# Patient Record
Sex: Female | Born: 1980 | Race: White | Hispanic: No | Marital: Single | Smoking: Never smoker
Health system: Southern US, Community
[De-identification: ages and names within clinical notes are randomized; demographics above are authoritative.]

## PROBLEM LIST (undated history)

## (undated) DIAGNOSIS — Z9889 Other specified postprocedural states: Secondary | ICD-10-CM

## (undated) DIAGNOSIS — Z98891 History of uterine scar from previous surgery: Secondary | ICD-10-CM

## (undated) HISTORY — DX: History of uterine scar from previous surgery: Z98.891

## (undated) HISTORY — DX: Other specified postprocedural states: Z98.890

## (undated) HISTORY — PX: TYMPANOSTOMY TUBE PLACEMENT: SHX32

---

## 2013-04-23 DIAGNOSIS — Z9889 Other specified postprocedural states: Secondary | ICD-10-CM

## 2013-04-23 HISTORY — DX: Other specified postprocedural states: Z98.890

## 2013-04-23 HISTORY — PX: LEEP: SHX91

## 2014-12-27 ENCOUNTER — Ambulatory Visit (INDEPENDENT_AMBULATORY_CARE_PROVIDER_SITE_OTHER): Payer: Self-pay | Admitting: Pulmonary Disease

## 2014-12-27 ENCOUNTER — Telehealth: Payer: Self-pay | Admitting: Pulmonary Disease

## 2014-12-27 ENCOUNTER — Encounter (HOSPITAL_BASED_OUTPATIENT_CLINIC_OR_DEPARTMENT_OTHER): Payer: Self-pay | Admitting: *Deleted

## 2014-12-27 ENCOUNTER — Encounter: Payer: Self-pay | Admitting: Pulmonary Disease

## 2014-12-27 ENCOUNTER — Emergency Department (HOSPITAL_BASED_OUTPATIENT_CLINIC_OR_DEPARTMENT_OTHER)
Admission: EM | Admit: 2014-12-27 | Discharge: 2014-12-27 | Disposition: A | Discharge status: Home / Self Care | Payer: Self-pay | Attending: Emergency Medicine | Admitting: Emergency Medicine

## 2014-12-27 ENCOUNTER — Other Ambulatory Visit (INDEPENDENT_AMBULATORY_CARE_PROVIDER_SITE_OTHER): Payer: Self-pay

## 2014-12-27 ENCOUNTER — Emergency Department (HOSPITAL_BASED_OUTPATIENT_CLINIC_OR_DEPARTMENT_OTHER): Payer: Self-pay

## 2014-12-27 VITALS — BP 108/66 | HR 74 | Ht 62.0 in | Wt 131.2 lb

## 2014-12-27 DIAGNOSIS — R0602 Shortness of breath: Secondary | ICD-10-CM

## 2014-12-27 DIAGNOSIS — R0789 Other chest pain: Secondary | ICD-10-CM | POA: Insufficient documentation

## 2014-12-27 LAB — CBC WITH DIFFERENTIAL/PLATELET
BASOS ABS: 0 10*3/uL (ref 0.0–0.1)
Basophils Relative: 0.6 % (ref 0.0–3.0)
EOS ABS: 0.1 10*3/uL (ref 0.0–0.7)
Eosinophils Relative: 0.9 % (ref 0.0–5.0)
HCT: 42.6 % (ref 36.0–46.0)
Hemoglobin: 14.3 g/dL (ref 12.0–15.0)
LYMPHS ABS: 2.3 10*3/uL (ref 0.7–4.0)
Lymphocytes Relative: 37.5 % (ref 12.0–46.0)
MCHC: 33.5 g/dL (ref 30.0–36.0)
MCV: 85.9 fl (ref 78.0–100.0)
MONO ABS: 0.3 10*3/uL (ref 0.1–1.0)
Monocytes Relative: 4.9 % (ref 3.0–12.0)
NEUTROS PCT: 56.1 % (ref 43.0–77.0)
Neutro Abs: 3.5 10*3/uL (ref 1.4–7.7)
Platelets: 249 10*3/uL (ref 150.0–400.0)
RBC: 4.96 Mil/uL (ref 3.87–5.11)
RDW: 13.1 % (ref 11.5–15.5)
WBC: 6.3 10*3/uL (ref 4.0–10.5)

## 2014-12-27 LAB — TROPONIN I

## 2014-12-27 LAB — D-DIMER, QUANTITATIVE (NOT AT ARMC)

## 2014-12-27 MED ORDER — ALBUTEROL SULFATE HFA 108 (90 BASE) MCG/ACT IN AERS: 2.0000 | INHALATION_SPRAY | Freq: Four times a day (QID) | RESPIRATORY_TRACT | Status: DC | PRN

## 2014-12-27 NOTE — Telephone Encounter (Signed)
LMTCB

## 2014-12-27 NOTE — ED Notes (Signed)
ekg in progress

## 2014-12-27 NOTE — ED Notes (Signed)
Patient transported to X-ray 
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
## 2014-12-27 NOTE — Patient Instructions (Signed)
We'll order some lung function tests. Start you on albuterol inhaler when necessary. Check some blood tests. Return to clinic in 2 months.

## 2014-12-27 NOTE — ED Notes (Signed)
EKG performed and pt placed on cardiac monitoring
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
## 2014-12-27 NOTE — Discharge Instructions (Signed)
Nonspecific Chest Pain  Take Tylenol or Advil for pain as needed. Call the [facility] or any of the numbers on the resource guide to get a primary care physician and to arrange to be seen if not feeling better within one or 2 weeks. Chest pain can be caused by many different conditions. There is always a chance that your pain could be related to something serious, such as a heart attack or a blood clot in your lungs. Chest pain can also be caused by conditions that are not life-threatening. If you have chest pain, it is very important to follow up with your health care provider. CAUSES  Chest pain can be caused by:  Heartburn.  Pneumonia or bronchitis.  Anxiety or stress.  Inflammation around your heart (pericarditis) or lung (pleuritis or pleurisy).  A blood clot in your lung.  A collapsed lung (pneumothorax). It can develop suddenly on its own (spontaneous pneumothorax) or from trauma to the chest.  Shingles infection (varicella-zoster virus).  Heart attack.  Damage to the bones, muscles, and cartilage that make up your chest wall. This can include:  Bruised bones due to injury.  Strained muscles or cartilage due to frequent or repeated coughing or overwork.  Fracture to one or more ribs.  Sore cartilage due to inflammation (costochondritis). RISK FACTORS  Risk factors for chest pain may include:  Activities that increase your risk for trauma or injury to your chest.  Respiratory infections or conditions that cause frequent coughing.  Medical conditions or overeating that can cause heartburn.  Heart disease or family history of heart disease.  Conditions or health behaviors that increase your risk of developing a blood clot.  Having had chicken pox (varicella zoster). SIGNS AND SYMPTOMS Chest pain can feel like:  Burning or tingling on the surface of your chest or deep in your chest.  Crushing, pressure, aching, or squeezing pain.  Dull  or sharp pain that is worse when you move, cough, or take a deep breath.  Pain that is also felt in your back, neck, shoulder, or arm, or pain that spreads to any of these areas. Your chest pain may come and go, or it may stay constant. DIAGNOSIS Lab tests or other studies may be needed to find the cause of your pain. Your health care provider may have you take a test called an ambulatory ECG (electrocardiogram). An ECG records your heartbeat patterns at the time the test is performed. You may also have other tests, such as:  Transthoracic echocardiogram (TTE). During echocardiography, sound waves are used to create a picture of all of the heart structures and to look at how blood flows through your heart.  Transesophageal echocardiogram (TEE).This is a more advanced imaging test that obtains images from inside your body. It allows your health care provider to see your heart in finer detail.  Cardiac monitoring. This allows your health care provider to monitor your heart rate and rhythm in real time.  Holter monitor. This is a portable device that records your heartbeat and can help to diagnose abnormal heartbeats. It allows your health care provider to track your heart activity for several days, if needed.  Stress tests. These can be done through exercise or by taking medicine that makes your heart beat more quickly.  Blood tests.  Imaging tests. TREATMENT  Your treatment depends on what is causing your chest pain. Treatment may include:  Medicines. These may include:  Acid blockers for heartburn.  Anti-inflammatory medicine.  Pain medicine for inflammatory conditions.  Antibiotic medicine, if an infection is present.  Medicines to dissolve blood clots.  Medicines to treat coronary artery disease.  Supportive care for conditions that do not require medicines. This may include:  Resting.  Applying heat or cold packs to injured areas.  Limiting activities until pain  decreases. HOME CARE INSTRUCTIONS  If you were prescribed an antibiotic medicine, finish it all even if you start to feel better.  Avoid any activities that bring on chest pain.  Do not use any tobacco products, including cigarettes, chewing tobacco, or electronic cigarettes. If you need help quitting, ask your health care provider.  Do not drink alcohol.  Take medicines only as directed by your health care provider.  Keep all follow-up visits as directed by your health care provider. This is important. This includes any further testing if your chest pain does not go away.  If heartburn is the cause for your chest pain, you may be told to keep your head raised (elevated) while sleeping. This reduces the chance that acid will go from your stomach into your esophagus.  Make lifestyle changes as directed by your health care provider. These may include:  Getting regular exercise. Ask your health care provider to suggest some activities that are safe for you.  Eating a heart-healthy diet. A registered dietitian can help you to learn healthy eating options.  Maintaining a healthy weight.  Managing diabetes, if necessary.  Reducing stress. SEEK MEDICAL CARE IF:  Your chest pain does not go away after treatment.  You have a rash with blisters on your chest.  You have a fever. SEEK IMMEDIATE MEDICAL CARE IF:   Your chest pain is worse.  You have an increasing cough, or you cough up blood.  You have severe abdominal pain.  You have severe weakness.  You faint.  You have chills.  You have sudden, unexplained chest discomfort.  You have sudden, unexplained discomfort in your arms, back, neck, or jaw.  You have shortness of breath at any time.  You suddenly start to sweat, or your skin gets clammy.  You feel nauseous or you vomit.  You suddenly feel light-headed or dizzy.  Your heart begins to beat quickly, or it feels like it is skipping beats. These symptoms may  represent a serious problem that is an emergency. Do not wait to see if the symptoms will go away. Get medical help right away. Call your local emergency services (911 in the U.S.). Do not drive yourself to the hospital.   This information is not intended to replace advice given to you by your health care provider. Make sure you discuss any questions you have with your health care provider.   Document Released: 11/19/2004 Document Revised: 03/02/2014 Document Reviewed: 09/15/2013 Elsevier Interactive Patient Education 2016 [employer].  Emergency Department Resource Guide 1) Find a Doctor and Pay Out of Pocket Although you won't have to find out who is covered by your insurance plan, it is a good idea to ask around and get recommendations. You will then need to call the office and see if the doctor you have chosen will accept you as a new patient and what types of options they offer for patients who are self-pay. Some doctors offer discounts or will set up payment plans for their patients who do not have insurance, but you will need to ask so you aren't surprised when you get to your appointment.  2) Contact Your Local Health Department Not  all health departments have doctors that can see patients for sick visits, but many do, so it is worth a call to see if yours does. If you don't know where your local health department is, you can check in your phone book. The CDC also has a tool to help you locate your state's health department, and many state websites also have listings of all of their local health departments.  3) Find a Walk-in Clinic If your illness is not likely to be very severe or complicated, you may want to try a walk in clinic. These are popping up all over the country in pharmacies, drugstores, and shopping centers. They're usually staffed by nurse practitioners or physician assistants that have been trained to treat common illnesses and complaints. They're usually fairly quick and  inexpensive. However, if you have serious medical issues or chronic medical problems, these are probably not your best option.  No Primary Care Doctor: - Call Health Connect at  [phone] - they can help you locate a primary care doctor that  accepts your insurance, provides certain services, etc. - Physician Referral Service- [phone]  Chronic Pain Problems: Organization         Address  Phone   Notes  [facility]  [phone] Patients need to be referred by their primary care doctor.   Medication Assistance: Organization         Address  Phone   Notes  [facility] [address] [place], [place] [address] [phone] --Must be a resident of [facility] [place] -- Must have NO insurance coverage whatsoever (no Medicaid/ Medicare, etc.) -- The pt. MUST have a primary care doctor that directs their care regularly and follows them in the community   MedAssist  [phone]   [employer]  [phone]) [phone]    Agencies that provide inexpensive medical care: Organization         Address  Phone   Notes  [facility]  [phone]   [facility]    [phone]   [facility] [address] [place], [place] [address] [phone]   [facility] [address], [place] [phone]   Planned Parenthood    [phone]   [facility]    [phone]) [phone]   [facility]  [address], [place] Phone:  [phone], Fax:  [phone] Hours of Operation:  9 am - 6 pm, M-F.  Also accepts Medicaid/Medicare and self-pay.  [facility]  [address], [place] Phone: [phone]) [phone], Fax: [phone]. Hours of Operation:  8:30 am - 5:30 pm, M-F.  Also accepts Medicaid and self-pay.  [facility] [place] [address], [place] Phone: [phone]) [phone]   [facility] [address], [place], [place] [phone], Ext. [phone] Mondays & Thursdays: 7-9 AM.  First 15 patients are seen on a first come, first serve basis.    Medicaid-accepting [facility] Providers:  Organization         Address  Phone   Notes  [facility] [address], [place] [phone] Also accepts self-pay patients.  [facility] [address], [place]  [phone]   [facility] [address], [place] [phone]) [phone]   [facility] [address] [place] [phone]   Renaye Rakers [address], [place]   [phone] Only accepts [place] Access [place] patients after they have their name applied to their card.   Self-Pay (no insurance) in [facility]:  Organization         Address  Phone   Notes  Sickle Cell Patients, [facility] [address] [place], [place] [phone]   [facility] [address] [place], [place] [phone]   [facility]  [address], [place] [phone]   [facility]/Dr. Osei-Bonsu  [address], [place] or [address], [place] [phone] Phone number for both [place] and [place] locations is the same.  [facility]gent Medical and [facility] [address], [place] [phone]   [facility] [address] [place] or [address] [phone]   [facility] [address], [place] [phone], phone; [phone], fax Sees patients 1st and 3rd Saturday of every month.  Must not qualify for public or private insurance (i.e. Medicaid, Medicare, [place] Health Choice, Veterans' Benefits)  Household income should be no more than 200% of the poverty level The clinic cannot treat you if you are pregnant or  think you are pregnant  Sexually transmitted diseases are not treated at the clinic.    Dental Care: Organization         Address  Phone  Notes  [facility] [address] [place], [place] [phone] Accepts children up to age 27 who are enrolled in [place] or [place] Health Choice; pregnant women with a Medicaid card; and children who have applied for Medicaid or [place] Health Choice, but were declined, whose parents can pay a reduced fee at time of service.  [facility] of [facility]  [address], [place] [phone] Accepts children up to age 53 who are enrolled in [place] or [place] Health Choice; pregnant women with a Medicaid card; and children who have applied for Medicaid or [place] Health Choice, but were declined, whose parents can pay a reduced fee at time of service.  [facility]  [address] [place], [place] [phone] Patients are seen by appointment only. Walk-ins are not accepted. [facility] will see patients 61 years of age and older. Monday - Tuesday (8am-5pm) Most Wednesdays (8:30-5pm) $30 per visit, cash only  [facility]  [address], [facility] [phone] Patients are seen by appointment only. Walk-ins are not accepted. [facility] will see patients 50 years of age and older. One Wednesday Evening (Monthly: Volunteer Based).  $30 per visit, cash only  [employer]  [phone] for adults; Children under age 38, call Graduate Pediatric Dentistry at [phone]. Children aged 106-14, please call [phone] to request a pediatric application.  Dental services are provided in all areas of dental care including fillings, crowns and bridges, complete and partial dentures, implants, gum treatment, root canals, and extractions. Preventive care is also provided. Treatment is provided to both adults  and children. Patients are selected via a lottery and there is often a waiting list.   [facility] [address] [place]  [phone] [email or web address]  [facility] [address] [place], [place] [phone], Ext. [phone] Second and Fourth Thursday of each month, opens at 6:30 AM; Clinic ends at 9 AM.  Patients are seen on a first-come first-served basis, and a limited number are seen during each clinic.   [facility]  [address] Ether Griffins [place], [place] [phone]   Eligibility Requirements You must have lived in [place], [place], or [place] counties for at least the last three months.   You cannot be eligible for state or federal sponsored [place], including [employer], [place], or [employer].   You generally cannot be eligible for healthcare insurance through your employer.    How to apply: Eligibility screenings are held every Tuesday and Wednesday afternoon from 1:00 pm until 4:00 pm. You do not need an appointment for the interview!  [facility] [address] [place], [place] [phone]   [facility]  [phone]   [facility] Health Department  [phone]   [facility]  [phone]    Behavioral Health Resources in the Community: Intensive Outpatient Programs Organization         Address  Phone  Notes  [facility] [address] [place] [address] [place], [place] [phone]   [facility] Outpatient [address] [place], [place] [phone]   [facility] [address] [place], [place]  [phone]   [facility] [address],  [place], [place] [phone] or [phone]   Substance Abuse Resources Organization         Address  Phone  Notes  Alcohol [facility] Drug Services  [phone]   [facility]  [phone]   [facility] [place]  [phone]     Floydene Flock  [phone]   Residential & Outpatient Substance Abuse Program  [phone]   Psychological Services Organization         Address  Phone  Notes  [facility] Health  [phone]   [facility]  [phone]   [facility] [address], [place] [phone] or [phone]    Mobile Crisis Teams Organization         Address  Phone  Notes  [facility], Mobile Crisis Care Unit  [phone]   [facility]  [address] [place], [place] [phone]   Doristine Locks [address] [place] [phone]    Self-Help/Support Groups Organization         Address  Phone             Notes  [facility] [place] - variety of support groups  336- I7437963 Call for more information  Narcotics Anonymous (NA), [facility] [address], [employer] [place]  2 meetings at this location   Statistician         Address  Phone  Notes  [facility] [address],    [place]  [phone]   [facility]  [address], [place] [address], [place], [place] [phone]   [facility] [address] [place], [place] [phone] Admissions: 8am-3pm M-F  [facility] [address],    [place], [place] [phone]   [facility] [address] Starling Manns [place], [place] [phone]   [facility] [address]  [place], [place] [phone]   [facility] - Intensive Outpatient [phone] [address], [place], [place] [phone]   [facility] ([facility]iction [facility] [address] [place].,  [place], [place] [phone] or [phone]   [facility] ([facility]) [address], [place], [place] [phone] Accepts Medicaid  [facility] [address]  [place] [phone] Substance Abuse/Addiction Treatment   [facility] Organization         Address  Phone  Notes  [facility]  [phone]   Angie Fava, PhD [address] Ervin Knack [place], [place]   [phone] or [phone]   [facility] Behavioral   [address] [place], [place] [phone]   [facility] [address], [place], [place] [phone] Insurance/Medicaid/sponsorship through [facility] and Families [address]                                    [place], [place] [phone] Therapy/tele-psych/case  [facility] [address], [place] [phone]    Dr. Lolly Mustache  [phone]   [facility] [place]  [employer] [facility] Dept. 1) [address] [place] 2) [address], [place] 3)  [address], [place] [phone]  [phone]   [facility] Child Abuse Hotline 540-352-8064 or [phone] (After Hours)

## 2014-12-27 NOTE — ED Notes (Signed)
Pt amb to room 10 with quick steady gait in nad. Pt reports she has had intermittent central chest pain that increases with deep inspiration and certain movements/positions since mold was discovered in her office at work. Pt denies any chest pain at this time, pt is smiling in nad.

## 2014-12-27 NOTE — Telephone Encounter (Signed)
Called by Schering-PloughCrystal who stated that she was supposed to have a prescription for an albuterol inhaler called in after her visit with Dr. Isaiah SergeMannam today. Called [facility] at [phone] and left prescription for Albuterol Inhaler 108 mcg (90 mcg base, Disp #1, Sig: 2 puffs Q 6 hours PRN. Refill X 6.

## 2014-12-28 NOTE — Telephone Encounter (Signed)
Pt reports she no longer needs anything has she already had RX filled.

## 2014-12-31 LAB — IGE: IGE (IMMUNOGLOBULIN E), SERUM: 25 kU/L (ref <115)

## 2015-03-04 ENCOUNTER — Ambulatory Visit: Payer: Self-pay | Admitting: Pulmonary Disease

## 2016-07-30 ENCOUNTER — Emergency Department (HOSPITAL_COMMUNITY): Payer: BLUE CROSS/BLUE SHIELD

## 2016-07-30 ENCOUNTER — Emergency Department (HOSPITAL_COMMUNITY)
Admission: EM | Admit: 2016-07-30 | Discharge: 2016-07-30 | Disposition: A | Discharge status: Home / Self Care | Payer: BLUE CROSS/BLUE SHIELD | Attending: Emergency Medicine | Admitting: Emergency Medicine

## 2016-07-30 ENCOUNTER — Encounter (HOSPITAL_COMMUNITY): Payer: Self-pay

## 2016-07-30 DIAGNOSIS — Y999 Unspecified external cause status: Secondary | ICD-10-CM | POA: Insufficient documentation

## 2016-07-30 DIAGNOSIS — M549 Dorsalgia, unspecified: Secondary | ICD-10-CM | POA: Insufficient documentation

## 2016-07-30 DIAGNOSIS — Y9389 Activity, other specified: Secondary | ICD-10-CM | POA: Insufficient documentation

## 2016-07-30 DIAGNOSIS — M791 Myalgia: Secondary | ICD-10-CM | POA: Diagnosis not present

## 2016-07-30 DIAGNOSIS — R51 Headache: Secondary | ICD-10-CM | POA: Insufficient documentation

## 2016-07-30 DIAGNOSIS — Y92410 Unspecified street and highway as the place of occurrence of the external cause: Secondary | ICD-10-CM | POA: Insufficient documentation

## 2016-07-30 DIAGNOSIS — M542 Cervicalgia: Secondary | ICD-10-CM | POA: Diagnosis not present

## 2016-07-30 DIAGNOSIS — M7918 Myalgia, other site: Secondary | ICD-10-CM

## 2016-07-30 DIAGNOSIS — R079 Chest pain, unspecified: Secondary | ICD-10-CM | POA: Diagnosis not present

## 2016-07-30 MED ORDER — ONDANSETRON 4 MG PO TBDP
ORAL_TABLET | ORAL | Status: AC
  Filled 2016-07-30: qty 1

## 2016-07-30 MED ORDER — OXYCODONE-ACETAMINOPHEN 5-325 MG PO TABS
ORAL_TABLET | ORAL | Status: AC
  Administered 2016-07-30: 1 via ORAL
  Filled 2016-07-30: qty 1

## 2016-07-30 MED ORDER — METHOCARBAMOL 500 MG PO TABS: 500.0000 mg | ORAL_TABLET | Freq: Two times a day (BID) | ORAL | 0 refills | Status: DC

## 2016-07-30 MED ORDER — OXYCODONE-ACETAMINOPHEN 5-325 MG PO TABS
1.0000 | ORAL_TABLET | ORAL | Status: DC | PRN
  Administered 2016-07-30: 1 via ORAL

## 2016-07-30 MED ORDER — IBUPROFEN 800 MG PO TABS: 800.0000 mg | ORAL_TABLET | Freq: Three times a day (TID) | ORAL | 0 refills | Status: DC

## 2016-07-30 NOTE — ED Notes (Signed)
See EDP secondary assessment.  
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
## 2016-07-30 NOTE — Discharge Instructions (Signed)
Return if any problems.  See your Physician for recheck if pain persist past one week  °

## 2016-08-05 ENCOUNTER — Telehealth: Payer: Self-pay
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
## 2016-08-06 ENCOUNTER — Encounter (INDEPENDENT_AMBULATORY_CARE_PROVIDER_SITE_OTHER): Payer: Self-pay | Admitting: Physical Medicine and Rehabilitation

## 2016-08-06 ENCOUNTER — Ambulatory Visit (INDEPENDENT_AMBULATORY_CARE_PROVIDER_SITE_OTHER): Payer: BLUE CROSS/BLUE SHIELD | Admitting: Physical Medicine and Rehabilitation

## 2016-08-06 ENCOUNTER — Ambulatory Visit (INDEPENDENT_AMBULATORY_CARE_PROVIDER_SITE_OTHER): Payer: BLUE CROSS/BLUE SHIELD

## 2016-08-06 ENCOUNTER — Ambulatory Visit (INDEPENDENT_AMBULATORY_CARE_PROVIDER_SITE_OTHER): Payer: Self-pay | Admitting: Physical Medicine and Rehabilitation

## 2016-08-06 VITALS — BP 118/82 | HR 69

## 2016-08-06 DIAGNOSIS — S39012A Strain of muscle, fascia and tendon of lower back, initial encounter: Secondary | ICD-10-CM | POA: Diagnosis not present

## 2016-08-06 DIAGNOSIS — M609 Myositis, unspecified: Secondary | ICD-10-CM | POA: Diagnosis not present

## 2016-08-06 DIAGNOSIS — R202 Paresthesia of skin: Secondary | ICD-10-CM | POA: Diagnosis not present

## 2016-08-06 DIAGNOSIS — M542 Cervicalgia: Secondary | ICD-10-CM

## 2016-08-06 DIAGNOSIS — M545 Low back pain, unspecified: Secondary | ICD-10-CM

## 2016-08-06 DIAGNOSIS — S161XXA Strain of muscle, fascia and tendon at neck level, initial encounter: Secondary | ICD-10-CM | POA: Diagnosis not present

## 2016-08-06 DIAGNOSIS — M25551 Pain in right hip: Secondary | ICD-10-CM | POA: Diagnosis not present

## 2016-08-06 MED ORDER — BACLOFEN 10 MG PO TABS: 10.0000 mg | ORAL_TABLET | Freq: Three times a day (TID) | ORAL | 0 refills | Status: AC | PRN

## 2016-08-06 MED ORDER — MELOXICAM 15 MG PO TABS: 15.0000 mg | ORAL_TABLET | Freq: Every day | ORAL | 0 refills | Status: AC

## 2016-08-06 MED ORDER — PREDNISONE 50 MG PO TABS: ORAL_TABLET | ORAL | 0 refills | Status: DC
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
## 2016-08-07 ENCOUNTER — Encounter (INDEPENDENT_AMBULATORY_CARE_PROVIDER_SITE_OTHER): Payer: Self-pay | Admitting: Physical Medicine and Rehabilitation

## 2016-08-07 ENCOUNTER — Encounter: Payer: Self-pay | Admitting: Family Medicine

## 2016-08-07 ENCOUNTER — Ambulatory Visit (INDEPENDENT_AMBULATORY_CARE_PROVIDER_SITE_OTHER): Payer: BLUE CROSS/BLUE SHIELD | Admitting: Family Medicine

## 2016-08-07 DIAGNOSIS — S060X0A Concussion without loss of consciousness, initial encounter: Secondary | ICD-10-CM

## 2016-08-07 DIAGNOSIS — S060X9A Concussion with loss of consciousness of unspecified duration, initial encounter: Secondary | ICD-10-CM | POA: Insufficient documentation

## 2016-08-07 DIAGNOSIS — S060XAA Concussion with loss of consciousness status unknown, initial encounter: Secondary | ICD-10-CM | POA: Insufficient documentation

## 2016-08-07 NOTE — Patient Instructions (Signed)
Good to see you I do think you have a concussion.  I would like you to limit screen time (including your phone) to 30 minutes daily outside of homework.  In addition to this I recommend......  To help improve COGNITIVE function: Using fish oil/omega 3 that is 1000 mg (or roughly 600 mg EPA/DHA), starting as soon as possible after concussion, take: 3 tabs TWICE DAILY  for the next 3 days, then 3 tabs ONCE DAILY  for the next 10 days   To help reduce HEADACHES: Coenzyme Q10 160mg  ONCE DAILY May stop after headaches are resolved.                                                                                             To help with INSOMNIA: Melatonin 3-5mg  AT BEDTIME     Other medicines to help decrease inflammation Turmeric 500mg  twice daily Iron 65mg  with 500mg  of vitamin C  Vitamin D 2000 IU daily   I agree with Dr. Alvester MorinNewton on the other medicines  I want to see you again in 7-14 days (OK to double book)

## 2016-08-07 NOTE — Assessment & Plan Note (Signed)
Patient does have signs and symptoms consistent with a mild concussion. Patient was given different muscle relaxers, meloxicam as well as prednisone for the cervical spasm. Discussed with patient at great length. Patient will try over-the-counter medications to help with the concussion. Patient was seen another provider for the cervical muscle spasm and little different tendon if patient does not respond to this initial treatment. I believe the patient will do very well. We discussed which activities to limit, we discussed even possible food choices at be beneficial. Patient is going to come back and see me again in 7-10 days. Does feel that she is able to work at this time. Any worsening symptoms patient will come back sooner. Patient was concern with more of the numbness on the left side of the face this seems to have resolved.
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
## 2016-08-10 ENCOUNTER — Telehealth: Payer: Self-pay

## 2016-08-10 NOTE — Telephone Encounter (Signed)
Called patient to relay Dr. Michaelle CopasSmith's verbal order for her. He said that if she has weakness of her left side in addition to the tingling and numbness, she should go into the ER. He also said to keep an eye on the blurred vision but he is fine with seeing her next week since the blurriness has gone away today. I also just reiterated red flag signs and symptoms to her ie. Sudden dizziness, vomiting, disorientation and intense headache would also warrant a trip to the ER before her appointment with us. Patient voices understanding.

## 2016-08-10 NOTE — Telephone Encounter (Signed)
Patient called as she feels that the paraesthesia on the left side of her face seems to be occurring on the entire left side of her body. She said that she was taking a bath and was unable to feel the water on the left side of her body the same as she could on her right side. She also noted that she was driving and her left eye became blurry. She is not having the blurred vision today. Patient did not have any increase in symptoms otherwise over the weekend. ie no vomiting, intense headache or disorientation. Told her I would speak with Dr. Katrinka BlazingSmith and get back to her. She has an appointment next week with us.

## 2016-08-13 ENCOUNTER — Ambulatory Visit (HOSPITAL_COMMUNITY)
Admission: EM | Admit: 2016-08-13 | Discharge: 2016-08-13 | Disposition: A | Discharge status: Home / Self Care | Payer: BLUE CROSS/BLUE SHIELD

## 2016-08-17 ENCOUNTER — Ambulatory Visit (INDEPENDENT_AMBULATORY_CARE_PROVIDER_SITE_OTHER): Payer: BLUE CROSS/BLUE SHIELD | Admitting: Family Medicine

## 2016-08-17 ENCOUNTER — Other Ambulatory Visit (INDEPENDENT_AMBULATORY_CARE_PROVIDER_SITE_OTHER): Payer: BLUE CROSS/BLUE SHIELD

## 2016-08-17 ENCOUNTER — Encounter: Payer: Self-pay | Admitting: Family Medicine

## 2016-08-17 VITALS — BP 112/82 | HR 60 | Wt 141.0 lb

## 2016-08-17 DIAGNOSIS — M2550 Pain in unspecified joint: Secondary | ICD-10-CM

## 2016-08-17 DIAGNOSIS — S060X0D Concussion without loss of consciousness, subsequent encounter: Secondary | ICD-10-CM | POA: Diagnosis not present

## 2016-08-17 LAB — IBC PANEL
Iron: 83 ug/dL (ref 42–145)
SATURATION RATIOS: 24.2 % (ref 20.0–50.0)
TRANSFERRIN: 245 mg/dL (ref 212.0–360.0)

## 2016-08-17 LAB — CBC WITH DIFFERENTIAL/PLATELET
BASOS ABS: 0.1 10*3/uL (ref 0.0–0.1)
Basophils Relative: 1.1 % (ref 0.0–3.0)
EOS ABS: 0.1 10*3/uL (ref 0.0–0.7)
Eosinophils Relative: 2.2 % (ref 0.0–5.0)
HEMATOCRIT: 40.7 % (ref 36.0–46.0)
HEMOGLOBIN: 13.6 g/dL (ref 12.0–15.0)
LYMPHS PCT: 29.8 % (ref 12.0–46.0)
Lymphs Abs: 1.7 10*3/uL (ref 0.7–4.0)
MCHC: 33.4 g/dL (ref 30.0–36.0)
MCV: 88.9 fl (ref 78.0–100.0)
Monocytes Absolute: 0.4 10*3/uL (ref 0.1–1.0)
Monocytes Relative: 6.2 % (ref 3.0–12.0)
Neutro Abs: 3.5 10*3/uL (ref 1.4–7.7)
Neutrophils Relative %: 60.7 % (ref 43.0–77.0)
Platelets: 225 10*3/uL (ref 150.0–400.0)
RBC: 4.57 Mil/uL (ref 3.87–5.11)
RDW: 13.7 % (ref 11.5–15.5)
WBC: 5.8 10*3/uL (ref 4.0–10.5)

## 2016-08-17 LAB — TSH: TSH: 2.67 u[IU]/mL (ref 0.35–4.50)

## 2016-08-17 LAB — C-REACTIVE PROTEIN: CRP: <0.1 mg/dL — ABNORMAL LOW (ref 0.5–20.0)

## 2016-08-17 LAB — VITAMIN D 25 HYDROXY (VIT D DEFICIENCY, FRACTURES): VITD: 20.58 ng/mL — ABNORMAL LOW (ref 30.00–100.00)

## 2016-08-17 LAB — SEDIMENTATION RATE: SED RATE: 5 mm/h (ref 0–20)

## 2016-08-17 MED ORDER — GABAPENTIN 100 MG PO CAPS: 200.0000 mg | ORAL_CAPSULE | Freq: Every day | ORAL | 3 refills | Status: AC

## 2016-08-17 MED ORDER — VITAMIN D (ERGOCALCIFEROL) 1.25 MG (50000 UNIT) PO CAPS: 50000.0000 [IU] | ORAL_CAPSULE | ORAL | 0 refills | Status: AC

## 2016-08-17 MED ORDER — VENLAFAXINE HCL ER 37.5 MG PO CP24: 37.5000 mg | ORAL_CAPSULE | Freq: Every day | ORAL | 1 refills | Status: AC

## 2016-08-17 NOTE — Progress Notes (Unsigned)
Subjective:   @VITALSMCOMMENTS @  Chief Complaint: Traci Miller, DOB: [birth year], is a 36 y.o. female who presents for No chief complaint on file.   Injury date : *** Visit #: ***  History of Present Illness:   Patient's goals/priorities: ***  CLASS CURRENT GRADE COMMENTS                               Concussion Self-Reported Symptom Score Symptoms rated on a scale 1-6, in last 24 hours  Headache: ***    Nausea: ***  Vomiting: ***  Balance Difficulty: ***   Dizziness: ***  Fatigue: ***  Trouble Falling Asleep: ***   Sleep More Than Usual: ***  Sleep Less Than Usual: ***  Daytime Drowsiness: ***  Photophobia: ***  Phonophobia: ***  Feeling anxious: ***  Confused: ***  Irritability: ***  Sadness: ***  Nervousness: ***  Feeling More Emotional: ***  Numbness or Tingling: ***  Feeling Slowed Down: ***  Feeling Mentally Foggy: ***  Difficulty Concentrating: ***  Difficulty Remembering: ***  Visual Problems: ***  Neck Pain: ***  Tinnitus: ***   Total Symptom Score: *** Previous Symptom Score: ***  Review of Systems: Pertinent items are noted in HPI.  Review of History: Past Medical History: @PMHP @  Past Surgical History:  has a past surgical history that includes LEEP (04/2013); Cesarean section; and Tympanostomy tube placement. Family History: family history includes Diabetes Mellitus II in her mother; Heart disease in her paternal grandfather; Hypertension in her mother. Social History:  reports that she has never smoked. She has never used smokeless tobacco. She reports that she drinks alcohol. She reports that she does not use drugs. Current Medications: has a current medication list which includes the following prescription(s): baclofen, gabapentin, meloxicam, venlafaxine xr, and vitamin d (ergocalciferol). Allergies: is allergic to sulfa antibiotics.  Objective:    Physical Examination There were no vitals filed for this visit. General appearance:  alert, appears stated age and cooperative Head: Normocephalic, without obvious abnormality, atraumatic Eyes: conjunctivae/corneas clear. PERRL, EOM's intact. Fundi benign. Sclera anicteric. Lungs: clear to auscultation bilaterally and percussion Heart: regular rate and rhythm, S1, S2 normal, no murmur, click, rub or gallop Neurologic: CN 2-12 normal.  Sensation to pain, touch, and proprioception normal.  DTRs  normal in upper and lower extremities. No pathologic reflexes. Neg rhomberg, modified rhomberg, pronator drift, tandem gait, finger-to-nose; see post-concussion vestibular and oculomotor testing in chart Psychiatric: Oriented X3, intact recent and remote memory, judgement and insight, normal mood and affect  Concussion testing performed today:  Neurocognitive testing (ImPACT):  Baseline:*** Post #1: ***   Verbal Memory Composite *** (***%) *** (***%)   Visual Memory Composite *** (***%) *** (***%)   Visual Motor Speed Composite *** (***%) *** (***%)   Reaction Time Composite *** (***%) *** (***%)   Cognitive Efficiency Index *** ***     Vestibular Screening:   Pre VOMS  HA Score: *** Pre VOMS  Dizziness Score: ***   Headache  Dizziness  Smooth Pursuits *** ***  H. Saccades *** ***  V. Saccades *** ***  H. VOR *** ***  V. VOR *** ***  Visual Motor Sensitivity *** ***  Accommodation Right: *** cm Left: *** cm *** ***  Convergence: *** cm Divergence: *** cm *** ***   Balance Screen: ***  Additional testing performed today: { :28529}   Assessment:    Polyarthralgia - Plan: Sed Rate (ESR), C-reactive protein, Vitamin D (  25 hydroxy), IBC panel, TSH, ANA, CBC w/Diff, PTH, Intact and Calcium  Lilie Nuncio presents with the following concussion subtypes. [] Cognitive [] Cervical [] Vestibular [] Ocular [] Migraine [] Anxiety/Mood   Plan:   Action/Discussion: Reviewed diagnosis, management options, expected outcomes, and the reasons for scheduled and emergent follow-up.  Questions were adequately answered. Patient expressed verbal understanding and agreement with the following plan.      Participation in school/work: Patient is cleared to return to work/school and activities of daily living without restrictions.  Patient is not cleared to return to work/school until further notice.  Patient may return to work/school on ***, with the following restrictions/supports:  Length of Day:  Please allow patient to use *** class as study hall in a quiet area.  Shortened school/work day: Recommend *** until ***.  Recommend core classes only.  Extra Time:  Take mental rest breaks during the day as needed. Check for return of symptoms when participating in any activities that require a significant amount of attention or concentration.  Allow extra time to complete tasks.  Please allow *** weeks to make up missed assignments, test, quizzes.  Visual/Vestibular Accommodations in School:  Allow patient to eat lunch in quiet environment with 1-2 classmates.  Allow patient to leave class 5 minutes before end of period to avoid busy/noisy hallway.  Please provide any supplemental learning materials (power points, lecture notes, handouts, etc) in minimum size 18 font and allow/provide any auditory supplements to learning when possible (books on tape, audio tape lectures, etc) to limit visual stress in the classroom.  Patient is cleared for auditory participation only. Patient is not cleared for homework, quizzes, or tests at this time.   Testing:  May begin taking tests/quizzes on *** with no more than one test/quiz per day.   No significant classroom or standardized testing until ***.  Home/Extracurricular:  Lessen work/homework load to allow adequate cognitive rest. Work *** minutes with intervals of *** minute breaks (total *** hours).  Limit visual stimulants including: driving, watching television/movies, reading, using cell phone, etc. - to ensure  relative visual cognitive rest. NOT cleared for video or phone games. May participate *** minutes with intervals of *** minute breaks (total *** hours).   Participation in physical activity: Patient is cleared to return to physical activity participation without restrictions.  Patient is not cleared for formal physical activity (includes physical education class, sports practices, sports games, weight training, etc) at this time.   However, we recommend that patient has 20-30 minutes of light cardiovascular activity daily, with NO risk of head injury (example: walking), staying below level of symptoms.  Recommend gradual progression to *** vestibular exercise (30-45 min/day) with no risk of head trauma while staying below level of symptoms.  See your exercise treatment menu for details.   Begin your exercise prescription on ____________ (see separate exercise prescription form)  Cleared for physical activity that poses NO RISK of head trauma.   Gradual return to physical activity under the supervision of a physician and/or athletic trainer  Once asymptomatic for 24 hours, patient may start Stage *** of CFPSM's { :10024} Exercise Progression Protocol. This is to be monitored by patient's { :28383}    Patient may start Stage *** of CFPSM's { :10024} Exercise Progression Protocol. This is to be monitored by patient's { :28383}.   Patient is not cleared for full contact activities, activities with risk of head trauma or unsupervised physical activity while participating in CFPSM's Exercise Progression. Check for return of symptoms (using Concussion  Education Form Symptom List) when participating in activity and 24 hours following. If symptoms return, patient to contact our office for further recommendations.    [place] Recommendations form has been given to patient allowing *** to progress patient back into full participation.  Active Treatment Strategies:  Fueling your brain is  important for recovery. It is essential to stay well hydrated, aiming for half of your body weight in fluid ounces per day (100 lbs = 50 oz). We also recommend eating breakfast to start your day and focus on a well-balanced diet containing lean protein, 'good' fats, and complex carbohydrates. See your nutrition / hydration handout for more details.   Quality sleep is vital in your concussion recovery. We encourage lots of sleep for the first 24-72 hours after injury but following this period it is important to regulate your sleep cycle. We encourage *** hours of quality sleep per night. See your sleep handout for more details and strategies to quality sleep.  IF NOT USING THE OPTIONS BELOW DELETE THEM  Treating your vestibular and visual dysfunction will decrease your recovery time and improve your symptoms. Begin your home vestibular exercise program as directed on your AVS.    Begin taking Amantadine medicine as directed.   Begin taking DHA supplement as directed.    Begin home exercise program for neck as directed.   Follow-up information:  Follow up appointment at [facility] in *** .   Call [place] at [phone] at least 24 hours after completion of Stage 4 with status update.  Patient needs to arrive 30 minutes prior to appointment to complete the following tests: { :28378}.    Patient Education:  Reviewed with patient the risks (i.e, a repeat concussion, post-concussion syndrome, second-impact syndrome) of returning to play prior to complete resolution, and thoroughly reviewed the signs and symptoms of concussion.Reviewed need for complete resolution of all symptoms, with rest AND exertion, prior to return to play.  Reviewed red flags for urgent medical evaluation: worsening symptoms, nausea/vomiting, intractable headache, musculoskeletal changes, focal neurological deficits.  [facility] Concussion Care Plan, which clearly outlines the  plans stated above, was given to patient.  I was personally involved with the physical evaluation of and am in agreement with the assessment and treatment plan for this patient.  Greater than 50% of this encounter was spent in direct consultation with the patient in evaluation, counseling, and coordination of care. Duration of encounter: { :28531} minutes.  After Visit Summary printed out and provided to patient as appropriate.  This note is written by Lyndal Pulley, in the presence of and acting as the scribe of [facility].

## 2016-08-17 NOTE — Patient Instructions (Addendum)
Good to see you  I am so sorry this is taking so long.  Effexor 37.5 mg daily  Gabapentin 1-2 pills at night no help with the sleep and nerves Stop the melatonin.  Half time at work [employer] downstairs today  See me again in 2 weeks (OK to double book)

## 2016-08-17 NOTE — Assessment & Plan Note (Signed)
Patient is somewhat's irritable still. Still somewhat symptomatic. Labs ordered today to further evaluate to make sure there is no other potential problem causing difficult. Started patient on Effexor gabapentin to see if this will help with some possible postconcussive headaches. Patient will follow-up again in 3-4 weeks. At that time if still symptomatic consider neurology consult for the headaches. We'll see if the medications or the labs show any signs of improvement for the fatigue.

## 2016-08-17 NOTE — Progress Notes (Signed)
Subjective:   I, Traci Miller, am serving as a Neurosurgeonscribe for [employer] Sofia Vanmeter, DO.  Chief Complaint: Traci Miller, DOB: 25-Dec-1980, is a 36 y.o. female who presents for follow-up for head injury. She did go back to work and experienced headache and eye fatigue. She went back into Urgent Care after seeing us as she had some leg swelling. They sent her to the ED where they performed CT scans. Patient has not returned to work since this episode.  No chief complaint on file.   Injury date : 07/30/2016 Visit #: 2  History of Present Illness:   Patient's goals/priorities: return to work without restrictions  Concussion Self-Reported Symptom Score Symptoms rated on a scale 1-6, in last 24 hours  Headache: 4    Nausea: 0  Vomiting: 0  Balance Difficulty: 0  Dizziness: 0  Fatigue: 6  Trouble Falling Asleep: 3   Sleep More Than Usual: 0  Sleep Less Than Usual: 0  Daytime Drowsiness:0  Photophobia: 2  Phonophobia: 4  Irritability: 4  Sadness: 0  Nervousness:0  Feeling More Emotional: 5  Numbness or Tingling: 3  Feeling Slowed Down:0  Feeling Mentally Foggy: 0  Difficulty Concentrating: 0  Difficulty Remembering: 0  Visual Problems: 0    Total Symptom Score: 31   Review of Systems: Pertinent items are noted in HPI.  Review of History: Past Medical History: @PMHP @  Past Surgical History:  has a past surgical history that includes LEEP (04/2013); Cesarean section; and Tympanostomy tube placement. Family History: family history includes Diabetes Mellitus II in her mother; Heart disease in her paternal grandfather; Hypertension in her mother. Social History:  reports that she has never smoked. She has never used smokeless tobacco. She reports that she drinks alcohol. She reports that she does not use drugs. Current Medications: has a current medication list which includes the following prescription(s): baclofen, ibuprofen, meloxicam, methocarbamol, and prednisone. Allergies: is  allergic to sulfa antibiotics.  Objective:    Physical Examination There were no vitals filed for this visit. General appearance: alert, appears stated age and cooperative Head: Normocephalic, without obvious abnormality, atraumatic Eyes: conjunctivae/corneas clear. PERRL, EOM's intact. Fundi benign. Sclera anicteric. Lungs: clear to auscultation bilaterally and percussion Heart: regular rate and rhythm, S1, S2 normal, no murmur, click, rub or gallop Neurologic: CN 2-12 normal.  Sensation to pain, touch, and proprioception normal.  DTRs  normal in upper and lower extremities. No pathologic reflexes. Neg rhomberg, modified rhomberg, pronator drift, tandem gait, finger-to-nose; see post-concussion vestibular and oculomotor testing in chart Psychiatric: Oriented X3, intact recent and remote memory, judgement and insight, normal mood and affect  Concussion testing performed today:  Neurocognitive testing (ImPACT):   Post #2   Verbal Memory Composite  82 (49%)   Visual Memory Composite  54 (20%)   Visual Motor Speed Composite  49.72 (100%)   Reaction Time Composite  .61 (43%)   Cognitive Efficiency Index  .26      Assessment:    No diagnosis found.  Traci Miller presents with the following concussion subtypes. [] Cognitive [] Cervical [] Vestibular [x] Ocular [] Migraine [] Anxiety/Mood   Plan:   Action/Discussion: Reviewed diagnosis, management options, expected outcomes, and the reasons for scheduled and emergent follow-up. Questions were adequately answered. Patient expressed verbal understanding and agreement with the following plan.     I was personally involved with the physical evaluation of and am in agreement with the assessment and treatment plan for this patient.  Greater than 50% of this encounter was spent in direct  consultation with the patient in evaluation, counseling, and coordination of care. Duration of encounter: 50 minutes.  After Visit Summary printed out  and provided to patient as appropriate.

## 2016-08-18 LAB — PTH, INTACT AND CALCIUM
Calcium: 9.4 mg/dL (ref 8.6–10.2)
PTH: 33 pg/mL (ref 14–64)

## 2016-08-18 LAB — ANA: ANA: NEGATIVE

## 2016-08-31 ENCOUNTER — Ambulatory Visit (INDEPENDENT_AMBULATORY_CARE_PROVIDER_SITE_OTHER): Payer: BLUE CROSS/BLUE SHIELD | Admitting: Family Medicine

## 2016-08-31 ENCOUNTER — Encounter: Payer: Self-pay | Admitting: Family Medicine

## 2016-08-31 DIAGNOSIS — S060X0D Concussion without loss of consciousness, subsequent encounter: Secondary | ICD-10-CM

## 2016-08-31 NOTE — Assessment & Plan Note (Signed)
Resolved at this time. Patient is responding well to the Effexor as well as gabapentin for the postconcussive headaches. Patient has had minimal amount. Patient will try to titrate down over the next several weeks. We discussed the long-term benefit is taking the medications as possible. Patient is able to do daily activities. Still having stress in her life with a recent move this likely contribute in. Patient will follow-up again in 2 months to make sure completely resolved and we'll discuss the medications again at great length.

## 2016-08-31 NOTE — Progress Notes (Signed)
Subjective:     Chief Complaint: Traci Miller, DOB: [birth year], is a 36 y.o. female who presents for follow-up for head injury. Patient last exam did have a back to work but was having headaches. Doing significantly better though at this time. Feels that the gabapentin in the Effexor has helped out significantly. States that the emotional variability that she was having has improved as well. Happy with the results. Not quite back to her baseline feeling much better.    Injury date : 07/30/2016 Visit #: 3  History of Present Illness:   Patient's goals/priorities: return to work without restrictions  Concussion Self-Reported Symptom Score Symptoms rated on a scale 1-6, in last 24 hours  Headache: 1   Nausea: 0  Vomiting: 0  Balance Difficulty: 0  Dizziness: 0  Fatigue: 1  Trouble Falling Asleep: 1   Sleep More Than Usual: 0  Sleep Less Than Usual: 0  Daytime Drowsiness:0  Photophobia: 1  Phonophobia: 0  Irritability: 1  Sadness: 0  Nervousness:0  Feeling More Emotional: 1  Numbness or Tingling: 1  Feeling Slowed Down:0  Feeling Mentally Foggy: 0  Difficulty Concentrating: 0  Difficulty Remembering: 0  Visual Problems: 0    Total Symptom Score: 5   Review of Systems: Review of Systems: No  visual changes, nausea, vomiting, diarrhea, constipation, dizziness, abdominal pain, skin rash, fevers, chills, night sweats, weight loss, swollen lymph nodes, body aches, joint swelling, muscle aches, chest pain, shortness of breath, mood changes.  Positive headaches  Review of History: Past Medical History:  Past Medical History:  Diagnosis Date  . H/O LEEP 04/2013  . S/P C-section     Past Surgical History:  has a past surgical history that includes LEEP (04/2013); Cesarean section; and Tympanostomy tube placement. Family History: family history includes Diabetes Mellitus II in her mother; Heart disease in her paternal grandfather; Hypertension in her mother. Social History:   reports that she has never smoked. She has never used smokeless tobacco. She reports that she drinks alcohol. She reports that she does not use drugs. Current Medications: has a current medication list which includes the following prescription(s): baclofen, gabapentin, meloxicam, venlafaxine xr, and vitamin d (ergocalciferol). Allergies: is allergic to sulfa antibiotics.  Objective:    Physical Examination Vitals:   08/31/16 1018  BP: 102/80  Pulse: 70   General appearance: alert, appears stated age and cooperative Head: Normocephalic, without obvious abnormality, atraumatic Eyes: conjunctivae/corneas clear. PERRL, EOM's intact. Fundi benign. Sclera anicteric. Lungs: clear to auscultation bilaterally and percussion Heart: regular rate and rhythm, S1, S2 normal, no murmur, click, rub or gallop Neurologic: CN 2-12 normal.  Sensation to pain, touch, and proprioception normal.  DTRs  normal in upper and lower extremities. No pathologic reflexes. Neg rhomberg, modified rhomberg, pronator drift, tandem gait, finger-to-nose; see post-concussion vestibular and oculomotor testing in chart Psychiatric: Oriented X3, intact recent and remote memory, judgement and insight, normal mood and affect    Assessment:    No diagnosis found.  Jadis Laural BenesJohnson presents with Resolved concussion with mild headache still noted.   Plan:

## 2016-08-31 NOTE — Patient Instructions (Addendum)
Good to see you  You are doing great  Continue the meds if helping.  Of the vitamins I would continue the vitamin D but the others you can decrease or discontinue.  If you continue the medications I do want to see you again in 3 months!

## 2016-09-03 ENCOUNTER — Ambulatory Visit (INDEPENDENT_AMBULATORY_CARE_PROVIDER_SITE_OTHER): Payer: BLUE CROSS/BLUE SHIELD | Admitting: Physical Medicine and Rehabilitation

## 2016-09-03 ENCOUNTER — Encounter (INDEPENDENT_AMBULATORY_CARE_PROVIDER_SITE_OTHER): Payer: Self-pay | Admitting: Physical Medicine and Rehabilitation

## 2016-09-03 VITALS — BP 123/80 | HR 86

## 2016-09-03 DIAGNOSIS — S161XXA Strain of muscle, fascia and tendon at neck level, initial encounter: Secondary | ICD-10-CM | POA: Diagnosis not present

## 2016-09-03 DIAGNOSIS — M25551 Pain in right hip: Secondary | ICD-10-CM

## 2016-09-03 DIAGNOSIS — R202 Paresthesia of skin: Secondary | ICD-10-CM | POA: Diagnosis not present

## 2016-09-03 DIAGNOSIS — S39012D Strain of muscle, fascia and tendon of lower back, subsequent encounter: Secondary | ICD-10-CM

## 2016-09-03 DIAGNOSIS — M542 Cervicalgia: Secondary | ICD-10-CM

## 2016-09-03 DIAGNOSIS — S060X0D Concussion without loss of consciousness, subsequent encounter: Secondary | ICD-10-CM | POA: Diagnosis not present

## 2016-09-03 DIAGNOSIS — M609 Myositis, unspecified: Secondary | ICD-10-CM
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
## 2016-09-03 NOTE — Progress Notes (Deleted)
Overall pain is a lot better, but still having some right hip and lower back pain. Neck is still stiff sometimes with turning a certain way, and left shoulder still hurts sometimes. Lower back and hip are the worst. Going up and down steps causes a lot of hip pain.

## 2016-09-04 ENCOUNTER — Encounter (INDEPENDENT_AMBULATORY_CARE_PROVIDER_SITE_OTHER): Payer: Self-pay | Admitting: Physical Medicine and Rehabilitation

## 2016-09-04 DIAGNOSIS — S39012D Strain of muscle, fascia and tendon of lower back, subsequent encounter: Secondary | ICD-10-CM | POA: Insufficient documentation

## 2016-09-04 DIAGNOSIS — S161XXA Strain of muscle, fascia and tendon at neck level, initial encounter: Secondary | ICD-10-CM | POA: Insufficient documentation

## 2016-09-04 DIAGNOSIS — M542 Cervicalgia: Secondary | ICD-10-CM | POA: Insufficient documentation

## 2016-09-04 DIAGNOSIS — M25551 Pain in right hip: Secondary | ICD-10-CM | POA: Insufficient documentation

## 2016-09-04 DIAGNOSIS — M609 Myositis, unspecified: Secondary | ICD-10-CM | POA: Insufficient documentation

## 2016-12-01 ENCOUNTER — Ambulatory Visit: Payer: BLUE CROSS/BLUE SHIELD | Admitting: Family Medicine

## 2016-12-15 ENCOUNTER — Ambulatory Visit (INDEPENDENT_AMBULATORY_CARE_PROVIDER_SITE_OTHER): Payer: BLUE CROSS/BLUE SHIELD | Admitting: Physical Medicine and Rehabilitation

## 2016-12-15 ENCOUNTER — Encounter (INDEPENDENT_AMBULATORY_CARE_PROVIDER_SITE_OTHER): Payer: Self-pay | Admitting: Physical Medicine and Rehabilitation

## 2016-12-15 VITALS — BP 124/82 | HR 61

## 2016-12-15 DIAGNOSIS — M79672 Pain in left foot: Secondary | ICD-10-CM

## 2016-12-15 DIAGNOSIS — M542 Cervicalgia: Secondary | ICD-10-CM

## 2016-12-15 DIAGNOSIS — G8929 Other chronic pain: Secondary | ICD-10-CM | POA: Diagnosis not present

## 2016-12-15 DIAGNOSIS — M545 Low back pain: Secondary | ICD-10-CM | POA: Diagnosis not present

## 2016-12-15 DIAGNOSIS — M609 Myositis, unspecified: Secondary | ICD-10-CM | POA: Diagnosis not present

## 2016-12-15 DIAGNOSIS — M25562 Pain in left knee: Secondary | ICD-10-CM

## 2016-12-15 MED ORDER — NAPROXEN 500 MG PO TABS: 500.0000 mg | ORAL_TABLET | Freq: Two times a day (BID) | ORAL | 0 refills | Status: AC

## 2016-12-15 NOTE — Progress Notes (Deleted)
Has been having a lot of left knee pain and left foot pain. Neck and back have been better since starting physical therapy. Wants to look at getting a [place]

## 2016-12-16 ENCOUNTER — Encounter (INDEPENDENT_AMBULATORY_CARE_PROVIDER_SITE_OTHER): Payer: Self-pay | Admitting: Physical Medicine and Rehabilitation
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
